# Patient Record
Sex: Male | Born: 1990 | Race: Black or African American | Hispanic: No | Marital: Single | Smoking: Never smoker
Health system: Southern US, Community
[De-identification: ages and names within clinical notes are randomized; demographics above are authoritative.]

## PROBLEM LIST (undated history)

## (undated) HISTORY — PX: NO PAST SURGERIES: SHX2092

---

## 2012-04-15 ENCOUNTER — Other Ambulatory Visit: Payer: Self-pay | Admitting: *Deleted

## 2012-04-15 ENCOUNTER — Other Ambulatory Visit: Payer: Self-pay | Admitting: Family Medicine

## 2012-04-15 DIAGNOSIS — M25562 Pain in left knee: Secondary | ICD-10-CM

## 2012-04-17 ENCOUNTER — Other Ambulatory Visit: Payer: Self-pay

## 2012-04-19 ENCOUNTER — Ambulatory Visit
Admission: RE | Admit: 2012-04-19 | Discharge: 2012-04-19 | Disposition: A | Discharge status: Home / Self Care | Payer: Managed Care, Other (non HMO) | Source: Ambulatory Visit | Attending: *Deleted | Admitting: *Deleted

## 2012-04-19 ENCOUNTER — Other Ambulatory Visit: Payer: Self-pay

## 2012-04-19 DIAGNOSIS — M25562 Pain in left knee: Secondary | ICD-10-CM

## 2013-07-12 ENCOUNTER — Emergency Department (HOSPITAL_COMMUNITY): Payer: Managed Care, Other (non HMO)

## 2013-07-12 ENCOUNTER — Encounter (HOSPITAL_COMMUNITY): Payer: Self-pay | Admitting: Emergency Medicine

## 2013-07-12 ENCOUNTER — Emergency Department (INDEPENDENT_AMBULATORY_CARE_PROVIDER_SITE_OTHER)
Admission: EM | Admit: 2013-07-12 | Discharge: 2013-07-12 | Disposition: A | Discharge status: Home / Self Care | Payer: Managed Care, Other (non HMO) | Source: Home / Self Care | Attending: Emergency Medicine | Admitting: Emergency Medicine

## 2013-07-12 DIAGNOSIS — K2970 Gastritis, unspecified, without bleeding: Secondary | ICD-10-CM

## 2013-07-12 DIAGNOSIS — E860 Dehydration: Secondary | ICD-10-CM

## 2013-07-12 DIAGNOSIS — R112 Nausea with vomiting, unspecified: Secondary | ICD-10-CM

## 2013-07-12 LAB — COMPREHENSIVE METABOLIC PANEL
ALT: 16 U/L (ref 0–53)
AST: 25 U/L (ref 0–37)
Albumin: 4.2 g/dL (ref 3.5–5.2)
CO2: 29 mEq/L (ref 19–32)
Calcium: 9.3 mg/dL (ref 8.4–10.5)
Creatinine, Ser: 0.95 mg/dL (ref 0.50–1.35)
GFR calc Af Amer: >90 mL/min (ref >90)
GFR calc non Af Amer: >90 mL/min (ref >90)
Sodium: 140 mEq/L (ref 135–145)
Total Protein: 8 g/dL (ref 6.0–8.3)

## 2013-07-12 LAB — CBC WITH DIFFERENTIAL/PLATELET
Basophils Absolute: 0 10*3/uL (ref 0.0–0.1)
Basophils Relative: 0 % (ref 0–1)
Eosinophils Absolute: 0 10*3/uL (ref 0.0–0.7)
Eosinophils Relative: 0 % (ref 0–5)
Lymphs Abs: 1.1 10*3/uL (ref 0.7–4.0)
MCH: 30.5 pg (ref 26.0–34.0)
MCV: 86.3 fL (ref 78.0–100.0)
Monocytes Absolute: 0.9 10*3/uL (ref 0.1–1.0)
Neutrophils Relative %: 63 % (ref 43–77)
Platelets: 231 10*3/uL (ref 150–400)
RDW: 12.8 % (ref 11.5–15.5)

## 2013-07-12 LAB — LIPASE, BLOOD: Lipase: 25 U/L (ref 11–59)

## 2013-07-12 LAB — POCT URINALYSIS DIP (DEVICE)
Leukocytes, UA: NEGATIVE
Nitrite: NEGATIVE
Protein, ur: NEGATIVE mg/dL
Urobilinogen, UA: 1 mg/dL (ref 0.0–1.0)

## 2013-07-12 MED ORDER — SUCRALFATE 1 GM/10ML PO SUSP: 1.0000 g | Freq: Three times a day (TID) | ORAL | Status: DC

## 2013-07-12 MED ORDER — GI COCKTAIL ~~LOC~~
30.0000 mL | Freq: Once | ORAL | Status: AC
  Administered 2013-07-12: 30 mL via ORAL

## 2013-07-12 MED ORDER — GI COCKTAIL ~~LOC~~
ORAL | Status: AC
  Filled 2013-07-12: qty 30

## 2013-07-12 MED ORDER — OMEPRAZOLE 40 MG PO CPDR: 40.0000 mg | DELAYED_RELEASE_CAPSULE | Freq: Every day | ORAL | Status: DC

## 2013-07-12 MED ORDER — ONDANSETRON 8 MG PO TBDP: 8.0000 mg | ORAL_TABLET | Freq: Three times a day (TID) | ORAL | Status: AC | PRN

## 2013-07-12 NOTE — ED Provider Notes (Signed)
CSN: [id number]     Arrival date & time 07/12/13  1343 History   First MD Initiated Contact with Patient 07/12/13 1506     Chief Complaint  Patient presents with  . Abdominal Pain   (Consider location/radiation/quality/duration/timing/severity/associated sxs/prior Treatment) HPI Comments: 22 year old male presents complaining of 5 days of nausea and vomiting, mild constipation. He started to have nausea with some vomiting. After multiple episodes of vomiting, he started to have some blood in the vomitus. He started to have abdominal pain after having the symptoms for 2 days. He is not currently feeling nauseous, he last vomited this morning. He has not had any blood in the vomitus since yesterday. He feels the abdominal pain in his upper abdomen, constant, aching, nonradiating. He notes that he does drink alcohol does not know if this could contribute. No history of any stomach surgeries. Does not take NSAIDs on a regular basis. He describes the constipation as hard balls of stool, he has had daily bowel movements though  Patient is a 22 y.o. male presenting with abdominal pain.  Abdominal Pain Associated symptoms: constipation, nausea and vomiting   Associated symptoms: no chest pain, no chills, no cough, no diarrhea, no dysuria, no fatigue, no fever, no hematuria, no shortness of breath and no sore throat     History reviewed. No pertinent past medical history. History reviewed. No pertinent past surgical history. History reviewed. No pertinent family history. History  Substance Use Topics  . Smoking status: Never Smoker   . Smokeless tobacco: Not on file  . Alcohol Use: Yes    Review of Systems  Constitutional: Negative for fever, chills and fatigue.  HENT: Negative for sore throat.   Eyes: Negative for visual disturbance.  Respiratory: Negative for cough and shortness of breath.   Cardiovascular: Negative for chest pain, palpitations and leg swelling.  Gastrointestinal: Positive  for nausea, vomiting, abdominal pain and constipation. Negative for diarrhea.  Genitourinary: Negative for dysuria, urgency, frequency and hematuria.  Musculoskeletal: Negative for arthralgias, myalgias, neck pain and neck stiffness.  Skin: Negative for rash.  Neurological: Negative for dizziness, weakness and light-headedness.    Allergies  Review of patient's allergies indicates no known allergies.  Home Medications   Current Outpatient Rx  Name  Route  Sig  Dispense  Refill  . omeprazole (PRILOSEC) 40 MG capsule   Oral   Take 1 capsule (40 mg total) by mouth daily.   30 capsule   0   . ondansetron (ZOFRAN ODT) 8 MG disintegrating tablet   Oral   Take 1 tablet (8 mg total) by mouth every 8 (eight) hours as needed for nausea or vomiting.   12 tablet   0   . sucralfate (CARAFATE) 1 GM/10ML suspension   Oral   Take 10 mLs (1 g total) by mouth 4 (four) times daily -  with meals and at bedtime.   420 mL   0    BP 120/81  Pulse 95  Temp(Src) 99.8 F (37.7 C) (Oral)  Resp 20  SpO2 98% Physical Exam  Nursing note and vitals reviewed. Constitutional: He is oriented to person, place, and time. He appears well-developed and well-nourished. No distress.  HENT:  Head: Normocephalic.  Cardiovascular: Regular rhythm and normal heart sounds.  Tachycardia present.   Pulmonary/Chest: Effort normal. No respiratory distress.  Abdominal: Normal appearance and bowel sounds are normal. There is no hepatosplenomegaly. There is tenderness in the epigastric area and left upper quadrant. There is no rigidity, no rebound,  no guarding, no CVA tenderness, no tenderness at McBurney's point and negative Murphy's sign. No hernia.  Neurological: He is alert and oriented to person, place, and time. Coordination normal.  Skin: Skin is warm and dry. No rash noted. He is not diaphoretic.  Psychiatric: He has a normal mood and affect. Judgment normal.    ED Course  Procedures (including critical care  time) Labs Review Labs Reviewed  CBC WITH DIFFERENTIAL - Abnormal; Notable for the following:    Monocytes Relative 17 (*)    All other components within normal limits  COMPREHENSIVE METABOLIC PANEL  LIPASE, BLOOD  POCT URINALYSIS DIP (DEVICE)   Imaging Review No results found.    MDM   1. Gastritis   2. Nausea & vomiting   3. Dehydration    Modest improvement after GI cocktail. This patient is most likely experiencing gastritis and has had a Mallory-Weiss tear. He is still had some vomiting without blood so this has most likely healed. His tachycardia is explained by dehydration, evidence by the orthostatic vital signs. He declines any IV fluids and prefers oral rehydration. We will treat for gastritis, also sending some labs to check for any other gross abnormalities. He will return to the emergency department if his symptoms worsen.   All labs came back completely normal.  Meds ordered this encounter  Medications  . gi cocktail (Maalox,Lidocaine,Donnatal)    Sig:   . omeprazole (PRILOSEC) 40 MG capsule    Sig: Take 1 capsule (40 mg total) by mouth daily.    Dispense:  30 capsule    Refill:  0    Order Specific Question:  Supervising Provider    Answer:  Lorenz Coaster, DAVID C [id number]  . sucralfate (CARAFATE) 1 GM/10ML suspension    Sig: Take 10 mLs (1 g total) by mouth 4 (four) times daily -  with meals and at bedtime.    Dispense:  420 mL    Refill:  0    Order Specific Question:  Supervising Provider    Answer:  Lorenz Coaster, DAVID C [id number]  . ondansetron (ZOFRAN ODT) 8 MG disintegrating tablet    Sig: Take 1 tablet (8 mg total) by mouth every 8 (eight) hours as needed for nausea or vomiting.    Dispense:  12 tablet    Refill:  0    Order Specific Question:  Supervising Provider    Answer:  Lorenz Coaster, DAVID C [[id number]]     Graylon Good, PA-C 07/13/13 2014

## 2013-07-12 NOTE — ED Notes (Signed)
C/o upper abdominal pain since Tuesday; w n/v, ?constipation?; drinks 2-3 days a week , consuming 5-6 drinks. Last consumption Saturday (reportedly okay Sunday and Monday )

## 2013-07-14 NOTE — ED Provider Notes (Signed)
Medical screening examination/treatment/procedure(s) were performed by non-physician practitioner and as supervising physician I was immediately available for consultation/collaboration.  Leslee Home, M.D.  Reuben Likes, MD 07/14/13 [phone]

## 2014-08-10 ENCOUNTER — Ambulatory Visit (INDEPENDENT_AMBULATORY_CARE_PROVIDER_SITE_OTHER): Payer: Self-pay | Admitting: Gastroenterology

## 2014-08-10 ENCOUNTER — Encounter: Payer: Self-pay | Admitting: Gastroenterology

## 2014-08-10 VITALS — BP 132/80 | HR 92 | Ht 67.75 in | Wt 159.5 lb

## 2014-08-10 DIAGNOSIS — R1013 Epigastric pain: Secondary | ICD-10-CM | POA: Insufficient documentation

## 2014-08-10 MED ORDER — HYOSCYAMINE SULFATE ER 0.375 MG PO TBCR: EXTENDED_RELEASE_TABLET | ORAL | Status: AC

## 2014-08-10 NOTE — Assessment & Plan Note (Signed)
One year history of progressive mid abdominal pain that is exacerbated by eating, often spontaneous and occasionally, and by nausea and vomiting.  Symptoms not responsive to Carafate and omeprazole.  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  Recommendations #1 upper endoscopy #2 trial of hyomax #3 discontinued Carafate

## 2014-08-10 NOTE — Patient Instructions (Signed)
You have been scheduled for an endoscopy. Please follow written instructions given to you at your visit today. If you use inhalers (even only as needed), please bring them with you on the day of your procedure. Your physician has requested that you go to www.startemmi.com and enter the access code given to you at your visit today. This web site gives a general overview about your procedure. However, you should still follow specific instructions given to you by our office regarding your preparation for the procedure.  Your prescriptions have been sent to your pharmacy

## 2014-08-10 NOTE — Progress Notes (Signed)
    _                                                                                                                History of Present Illness:  [name] is a 24 year old male for evaluation of abdominal pain.  Over the past year he's been complaining of periumbilical pain that radiates to his midepigastrium.  Pain is spontaneous but clearly worsens with eating.  It is occasionally accompanied by nausea and vomiting.  He will also awaken with discomfort.  He is on no gastric irritants including nonsteroidals.  He drinks only rarely.  He takes both Carafate and Prilosec without relief.   History reviewed. No pertinent past medical history. Past Surgical History  Procedure Laterality Date  . No past surgeries     family history includes Liver cancer (age of onset: 7654) in his father; Thyroid cancer in his mother. There is no history of Colon cancer, Colon polyps, Diabetes, Esophageal cancer, Gallbladder disease, or Heart disease. Current Outpatient Prescriptions  Medication Sig Dispense Refill  . omeprazole (PRILOSEC) 40 MG capsule Take 1 capsule (40 mg total) by mouth daily. 30 capsule 0  . ondansetron (ZOFRAN ODT) 8 MG disintegrating tablet Take 1 tablet (8 mg total) by mouth every 8 (eight) hours as needed for nausea or vomiting. 12 tablet 0  . sucralfate (CARAFATE) 1 GM/10ML suspension Take 10 mLs (1 g total) by mouth 4 (four) times daily -  with meals and at bedtime. 420 mL 0   No current facility-administered medications for this visit.   Allergies as of 08/10/2014  . (No Known Allergies)    reports that he has never smoked. He has never used smokeless tobacco. He reports that he drinks alcohol. He reports that he does not use illicit drugs.   Review of Systems: Pertinent positive and negative review of systems were noted in the above HPI section. All other review of systems were otherwise negative.  Vital signs were reviewed in today's medical record Physical  Exam: General: Well developed , well nourished, no acute distress Skin: anicteric Head: Normocephalic and atraumatic Eyes:  sclerae anicteric, EOMI Ears: Normal auditory acuity Mouth: No deformity or lesions Neck: Supple, no masses or thyromegaly Lungs: Clear throughout to auscultation Heart: Regular rate and rhythm; no murmurs, rubs or bruits Abdomen: Soft,and non distended. No masses, hepatosplenomegaly or hernias noted. Normal Bowel sounds.  There is mild tenderness in the midepigastrium Rectal:deferred Musculoskeletal: Symmetrical with no gross deformities  Skin: No lesions on visible extremities Pulses:  Normal pulses noted Extremities: No clubbing, cyanosis, edema or deformities noted Neurological: Alert oriented x 4, grossly nonfocal Cervical Nodes:  No significant cervical adenopathy Inguinal Nodes: No significant inguinal adenopathy Psychological:  Alert and cooperative. Normal mood and affect  See Assessment and Plan under Problem List

## 2014-08-19 ENCOUNTER — Ambulatory Visit (AMBULATORY_SURGERY_CENTER): Payer: Managed Care, Other (non HMO) | Admitting: Gastroenterology

## 2014-08-19 ENCOUNTER — Encounter: Payer: Self-pay | Admitting: Gastroenterology

## 2014-08-19 VITALS — BP 116/67 | HR 84 | Temp 98.3°F | Resp 17 | Ht 67.0 in | Wt 159.0 lb

## 2014-08-19 DIAGNOSIS — R1013 Epigastric pain: Secondary | ICD-10-CM

## 2014-08-19 DIAGNOSIS — R112 Nausea with vomiting, unspecified: Secondary | ICD-10-CM

## 2014-08-19 MED ORDER — SODIUM CHLORIDE 0.9 % IV SOLN: 500.0000 mL | INTRAVENOUS | Status: DC

## 2014-08-19 NOTE — Op Note (Signed)
[facility] [address] [place], [id number]   ENDOSCOPY PROCEDURE REPORT  PATIENT: Glenn Wells, [name]  MR#: [id number] BIRTHDATE: 02/14/1991 , 23  yrs. old GENDER: male ENDOSCOPIST: Louis Meckelobert D Leonora Gores, MD REFERRED BY:  Windle GuardWilson Elkins, M.D. PROCEDURE DATE:  08/19/2014 PROCEDURE:  EGD, diagnostic ASA CLASS:     Class I INDICATIONS:  nausea and vomiting. MEDICATIONS: Monitored anesthesia care and Propofol 400 mg IV TOPICAL ANESTHETIC:  DESCRIPTION OF PROCEDURE: After the risks benefits and alternatives of the procedure were thoroughly explained, informed consent was obtained.  The LB [id number] endoscope was introduced through the mouth and advanced to the third portion of the duodenum , Without limitations.  The instrument was slowly withdrawn as the mucosa was fully examined.      EXAM: The esophagus and gastroesophageal junction were completely normal in appearance.  The stomach was entered and closely examined.The antrum, angularis, and lesser curvature were well visualized, including a retroflexed view of the cardia and fundus. The stomach wall was normally distensable.  The scope passed easily through the pylorus into the duodenum.  Retroflexed views revealed no abnormalities.     The scope was then withdrawn from the patient and the procedure completed.  COMPLICATIONS: There were no immediate complications.  ENDOSCOPIC IMPRESSION: Normal appearing esophagus and GE junction, the stomach was well visualized and normal in appearance, normal appearing duodenum  RECOMMENDATIONS: Hold omeprazole Hyomax as needed abdominal pain  REPEAT EXAM:  eSigned:  Louis Meckelobert D Shernell Saldierna, MD 08/19/2014 9:48 AM    CC:

## 2014-08-19 NOTE — Progress Notes (Signed)
A/ox3, pleased with MAC, report to RN 

## 2014-08-19 NOTE — Patient Instructions (Signed)

## 2014-08-19 NOTE — Progress Notes (Addendum)
Pt was hiccupping when coming into recovery, pt was still asleep, pt then started coughing and foaming at the mouth, called for suction and Joe Rybicki CRNA, laid pt flat, rolled pt on left side and suctioned, as pt started awaking up pt continued to cough up fluid, once pt was awake and could respond to us pt was set up right, gave pt an emesis basin and left suction with pt with instruction on how to use it if needed. Pt had hippa sign hanging but under circumstance asked pt who was with him and if they could come back, pt stated "that is fine" Pt did not use suction but spit into emesis basin, pt did spit up some pink tinged fluid, advised pt of complications from EGD and what to watch for at home, pt verbalized understanding, also reaffirmed with pt that it was ok for care partner (after care partner had gone to get the car) to be back in recovery before pt left and pt stated "yes"-adm  O2 sats did not drop while in recovery.

## 2014-08-20 ENCOUNTER — Telehealth: Payer: Self-pay | Admitting: *Deleted

## 2014-08-20 NOTE — Telephone Encounter (Signed)
Number identifier, left message, follow-up  

## 2014-08-28 ENCOUNTER — Telehealth: Payer: Self-pay | Admitting: Gastroenterology

## 2014-08-28 ENCOUNTER — Encounter: Payer: Self-pay | Admitting: Gastroenterology

## 2014-08-28 NOTE — Telephone Encounter (Signed)
Patient had not stopped the Omeprazole. He has been taking the Hyomax. He continues to have stomach pain. He will stop the Omeprazole. Continue the Hyomax as directed. He is supposed to begin a new job. The start date has been postponed pending resolution of his "health issues." He wants to be able to tell them when he thinks he can start. He will call back 08/31/14 with an update.

## 2014-08-31 ENCOUNTER — Ambulatory Visit: Payer: Managed Care, Other (non HMO) | Admitting: Nurse Practitioner

## 2014-08-31 NOTE — Telephone Encounter (Signed)
Spoke with patient and he states the Hyomax is not helping his abdominal pain. He states he is moving this week and would like an OV prior to this. Scheduled with Willette ClusterPaula Guenther, NP today at 3:00 PM.

## 2014-09-01 ENCOUNTER — Encounter: Payer: Self-pay | Admitting: Nurse Practitioner

## 2014-09-01 ENCOUNTER — Ambulatory Visit (INDEPENDENT_AMBULATORY_CARE_PROVIDER_SITE_OTHER): Payer: Managed Care, Other (non HMO) | Admitting: Nurse Practitioner

## 2014-09-01 ENCOUNTER — Other Ambulatory Visit (INDEPENDENT_AMBULATORY_CARE_PROVIDER_SITE_OTHER): Payer: Managed Care, Other (non HMO)

## 2014-09-01 VITALS — BP 102/72 | HR 60 | Ht 69.0 in | Wt 162.0 lb

## 2014-09-01 DIAGNOSIS — N508 Other specified disorders of male genital organs: Secondary | ICD-10-CM

## 2014-09-01 DIAGNOSIS — N50819 Testicular pain, unspecified: Secondary | ICD-10-CM

## 2014-09-01 DIAGNOSIS — R1084 Generalized abdominal pain: Secondary | ICD-10-CM

## 2014-09-01 DIAGNOSIS — R109 Unspecified abdominal pain: Secondary | ICD-10-CM

## 2014-09-01 LAB — URINALYSIS, ROUTINE W REFLEX MICROSCOPIC
Bilirubin Urine: NEGATIVE
Hgb urine dipstick: NEGATIVE
KETONES UR: NEGATIVE
Leukocytes, UA: NEGATIVE
NITRITE: NEGATIVE
Total Protein, Urine: NEGATIVE
Urine Glucose: NEGATIVE
Urobilinogen, UA: 0.2 (ref 0.0–1.0)
WBC, UA: NONE SEEN — AB (ref 0–?)
pH: 6 (ref 5.0–8.0)

## 2014-09-01 NOTE — Patient Instructions (Signed)
You have been scheduled for a CT scan of the abdomen and pelvis at [facility] ([address]---this is in the same building as Press [facility]).   You are scheduled on 09-07-14 at 3:00pm. You should arrive 15 minutes prior to your appointment time for registration. Please follow the written instructions below on the day of your exam:  WARNING: IF YOU ARE ALLERGIC TO IODINE/X-RAY DYE, PLEASE NOTIFY RADIOLOGY IMMEDIATELY AT [phone]! YOU WILL BE GIVEN A 13 HOUR PREMEDICATION PREP.  1) Do not eat or drink anything after 11:00pm (4 hours prior to your test) 2) You have been given 2 bottles of oral contrast to drink. The solution may taste  better if refrigerated, but do NOT add ice or any other liquid to this solution. Shake well before drinking.    Drink 1 bottle of contrast @ 1:00pm (2 hours prior to your exam)  Drink 1 bottle of contrast @ 2:00pm (1 hour prior to your exam)  You may take any medications as prescribed with a small amount of water except for the following: Metformin, Glucophage, Glucovance, Avandamet, Riomet, Fortamet, Actoplus Met, Janumet, Glumetza or Metaglip. The above medications must be held the day of the exam AND 48 hours after the exam.  The purpose of you drinking the oral contrast is to aid in the visualization of your intestinal tract. The contrast solution may cause some diarrhea. Before your exam is started, you will be given a small amount of fluid to drink. Depending on your individual set of symptoms, you may also receive an intravenous injection of x-ray contrast/dye. Plan on being at [facility] for 30 minutes or long, depending on the type of exam you are having performed.  This test typically takes 30-45 minutes to complete.  If you have any questions regarding your exam or if you need to reschedule, you may call the CT department at [phone] between the hours of 8:00 am and 5:00 pm,  Monday-Friday.  ________________________________________________________________________   Your physician has requested that you go to the basement for the following lab work before leaving today: UA  PJ:[id number]

## 2014-09-01 NOTE — Progress Notes (Signed)
     History of Present Illness:   Patient is a 24 year old male recently evaluated by us for abdominal pain, nausea and vomiting. Pain was described as periumbilical/mid epigastrium, worse with eating.  Symptoms were refractory to Prilosec and Carafate. For evaluation patient underwent upper endoscopy 08/19/14 which was normal. Following EGD Prilosec was stopped and trial of  Hyomax given.   Patient called the office a few days ago with persistent abdominal pain. The nausea and vomiting have resolved. Hyomax caused increased heartburn. The periumbilical / mid upper abdominal pain has been present for about 1 year but does seem to be getting worse. At his first visit here patient reported exacerbation of pain with meals, today he says there are no known triggers as pain as just random. Pain does not wake up him up at night. Laying down and stretching out seems to help the discomfort.  No urinary symptoms. No fevers.  Bowel movements normal. Patient apparently saw his PCP and Claris GowerCharlotte a few weeks ago, told labs were all normal. Since then patient has developed some left-sided testicular discomfort  Current Medications, Allergies, Past Medical History, Past Surgical History, Family History and Social History were reviewed in [facility] electronic medical record.  Physical Exam: General: Pleasant, well developed , black male in no acute distress Head: Normocephalic and atraumatic Eyes:  sclerae anicteric, conjunctiva pink  Ears: Normal auditory acuity Lungs: Clear throughout to auscultation Heart: Regular rate and rhythm Abdomen: Soft, non distended, non-tender. No masses, no hepatomegaly. Normal bowel sounds Musculoskeletal: Symmetrical with no gross deformities  Extremities: No edema  Neurological: Alert oriented x 4, grossly nonfocal Psychological:  Alert and cooperative. Normal mood and affect  Assessment and Recommendations:    24 year old male with a one-year history of  periumbilical / mid upper abdominal pain / nausea and vomiting. Negative endoscopy. Nausea and vomiting have resolved but pain is persistent without any aggravating factors. PPI, Carafate nor bowel anti-spasmodics have been helpful. Patient tells me labs by PCP and Claris Gowerharlotte have all been normal . Patient is now endorsing some left testicular discomfort. Will check a urinalysis. Given the persistent pain will proceed with a CT scan of the abdomen and pelvis. We will call him with results. Advised to see PCP about testicular discomfort

## 2014-09-02 ENCOUNTER — Ambulatory Visit: Payer: Managed Care, Other (non HMO) | Admitting: Nurse Practitioner

## 2014-09-02 ENCOUNTER — Ambulatory Visit (INDEPENDENT_AMBULATORY_CARE_PROVIDER_SITE_OTHER)
Admission: RE | Admit: 2014-09-02 | Discharge: 2014-09-02 | Disposition: A | Discharge status: Home / Self Care | Payer: Managed Care, Other (non HMO) | Source: Ambulatory Visit | Attending: Nurse Practitioner | Admitting: Nurse Practitioner

## 2014-09-02 DIAGNOSIS — N50819 Testicular pain, unspecified: Secondary | ICD-10-CM | POA: Insufficient documentation

## 2014-09-02 DIAGNOSIS — R109 Unspecified abdominal pain: Secondary | ICD-10-CM

## 2014-09-02 MED ORDER — IOHEXOL 300 MG/ML  SOLN
100.0000 mL | Freq: Once | INTRAMUSCULAR | Status: AC | PRN
  Administered 2014-09-02: 100 mL via INTRAVENOUS

## 2014-09-02 NOTE — Progress Notes (Signed)
Reviewed and agree with management. Robert D. Kaplan, M.D., FACG  

## 2014-09-04 ENCOUNTER — Telehealth: Payer: Self-pay | Admitting: Nurse Practitioner

## 2014-09-07 ENCOUNTER — Telehealth: Payer: Self-pay | Admitting: Nurse Practitioner

## 2014-09-07 ENCOUNTER — Inpatient Hospital Stay: Admission: RE | Admit: 2014-09-07 | Payer: Managed Care, Other (non HMO) | Source: Ambulatory Visit

## 2014-09-07 NOTE — Telephone Encounter (Signed)
Left message for patient to call back  

## 2014-09-07 NOTE — Telephone Encounter (Signed)
See additional phone note for details from 2/15

## 2014-09-07 NOTE — Telephone Encounter (Signed)
Patient notified [place] moved [place]

## 2015-05-19 IMAGING — CT CT ABD-PELV W/ CM
2 of 4 series · 17 of 46 positions shown, 19 images · IV contrast (omnipaque)
Comparison: None.

CLINICAL DATA: Under show evaluation for generalized intermittent
periumbilical abdominal pain which is worse over the past year with
nausea and vomiting and now with fatigue and heartburn

EXAM:
CT ABDOMEN AND PELVIS WITH CONTRAST
TECHNIQUE: Multidetector CT imaging of the abdomen and pelvis was performed
using the standard protocol following bolus administration of
intravenous contrast.
CONTRAST:  100mL OMNIPAQUE IOHEXOL 300 MG/ML  SOLN

[Series 2: abd/ pel 5mm · axial · 0.64mm/px · z∈[+807,+1222]mm · 14 of 91 slices shown, 16 images]
[im 4/91  soft-tissue]
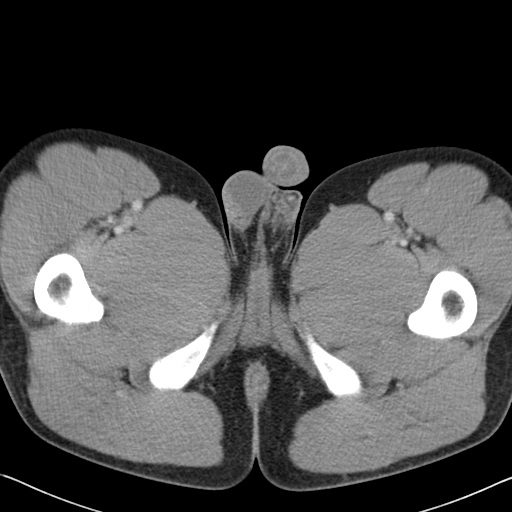
[im 4/91  bone]
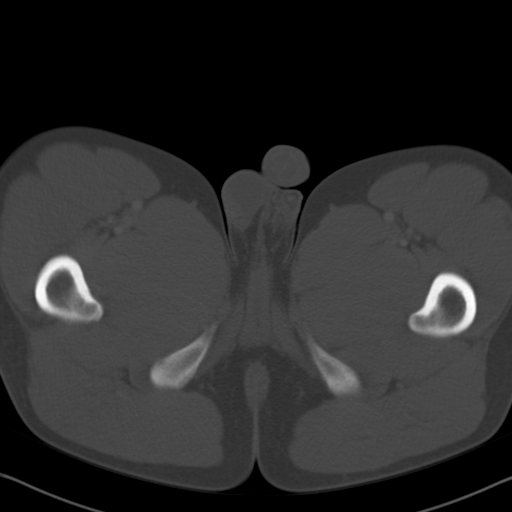
[im 11/91  soft-tissue]
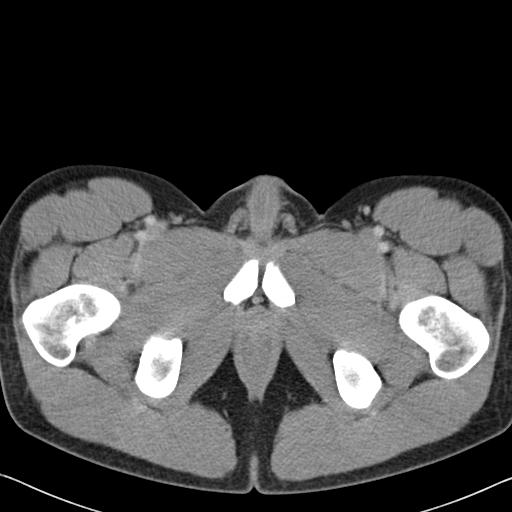
[im 19/91  soft-tissue]
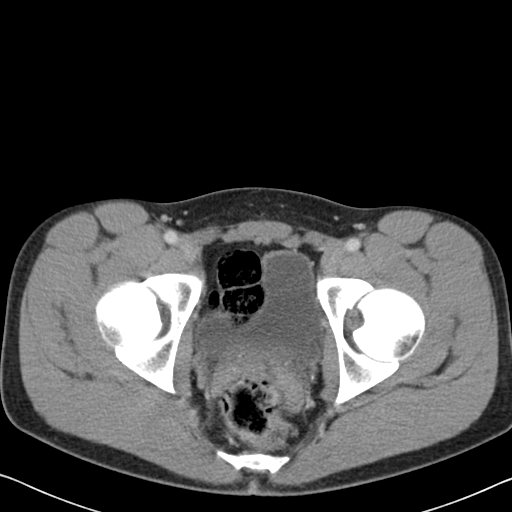
[im 26/91  soft-tissue]
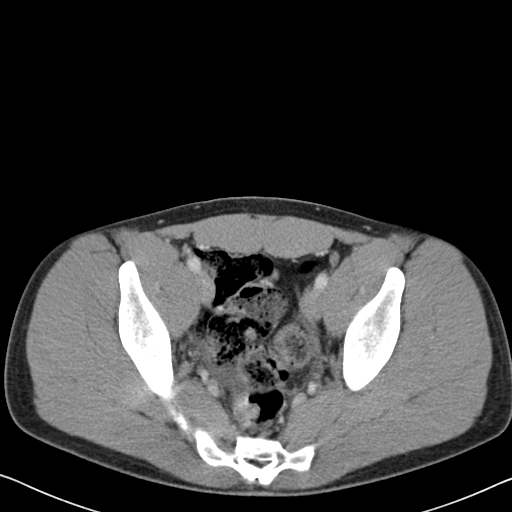
[im 29/91  soft-tissue]
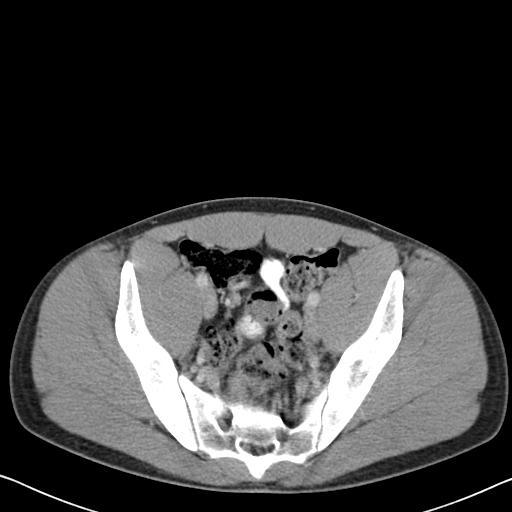
[im 37/91  soft-tissue]
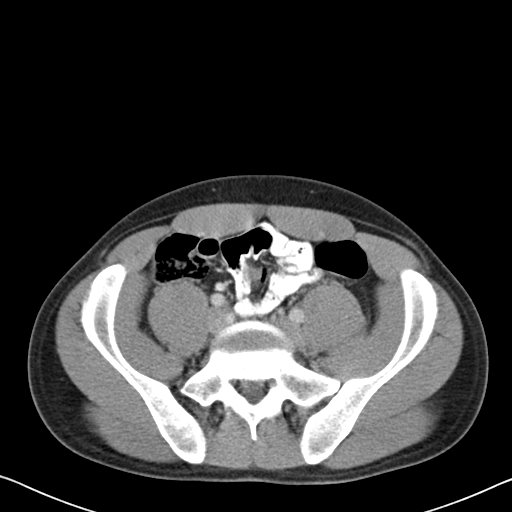
[im 44/91  soft-tissue]
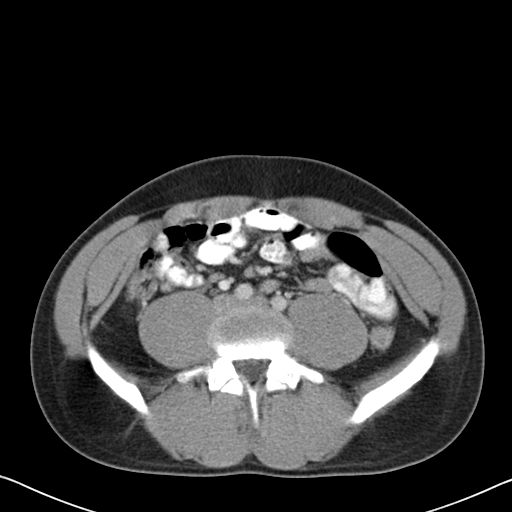
[im 47/91  soft-tissue]
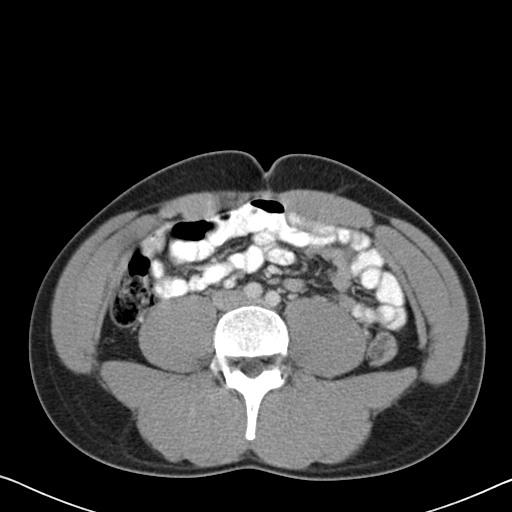
[im 55/91  soft-tissue]
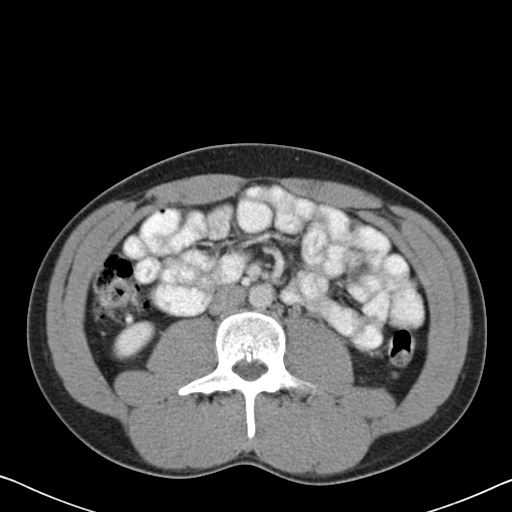
[im 55/91  bone]
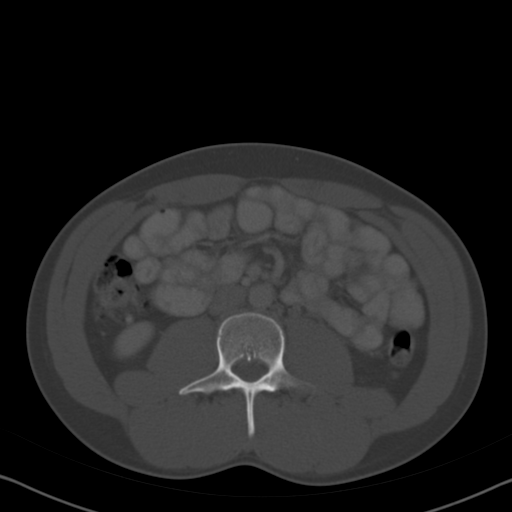
[im 62/91  soft-tissue]
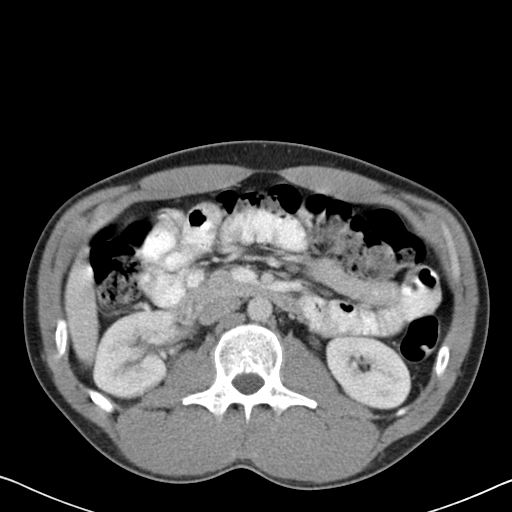
[im 69/91  soft-tissue]
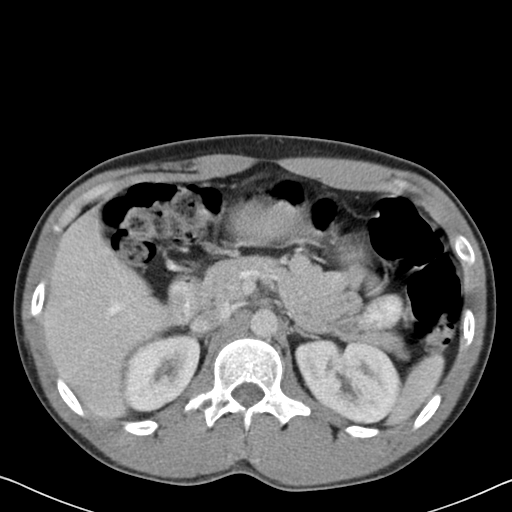
[im 73/91  soft-tissue]
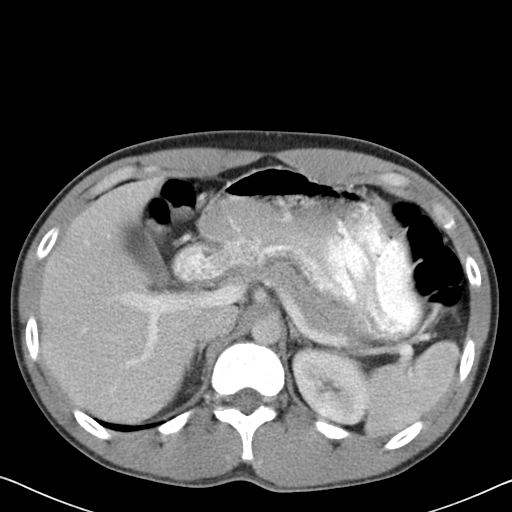
[im 80/91  soft-tissue]
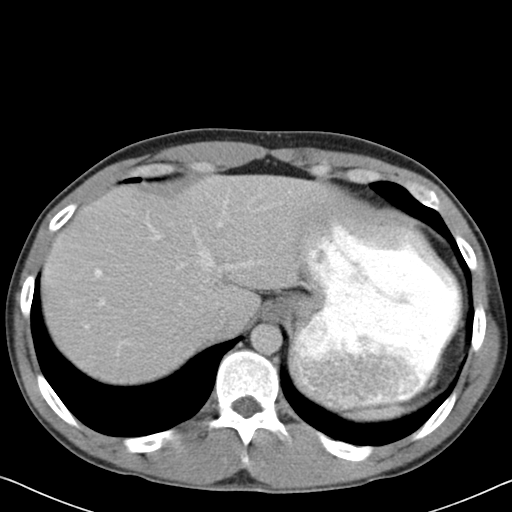
[im 87/91  soft-tissue]
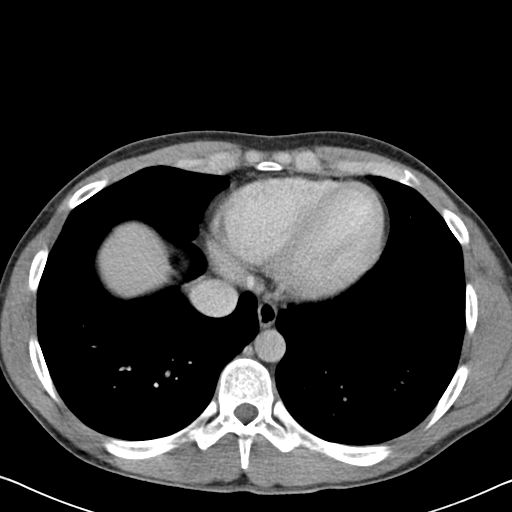

[Series 602: cor · coronal · 0.91mm/px · 3 of 110 slices shown]
[im 37/110  soft-tissue]
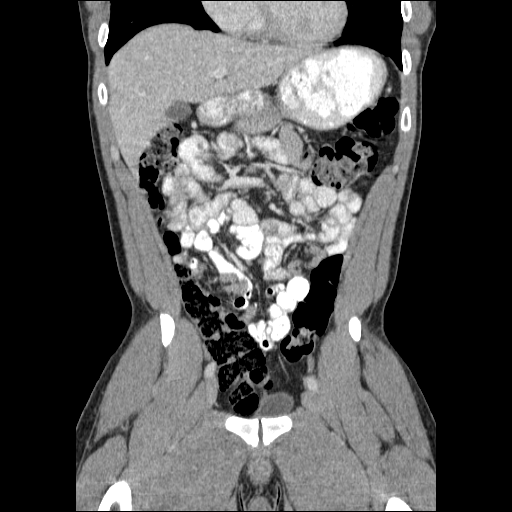
[im 49/110  soft-tissue]
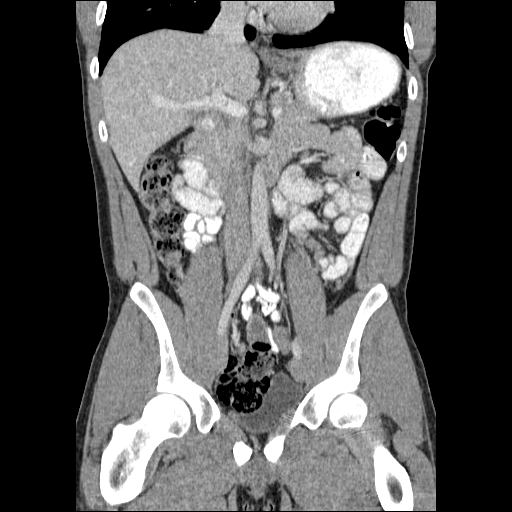
[im 61/110  soft-tissue]
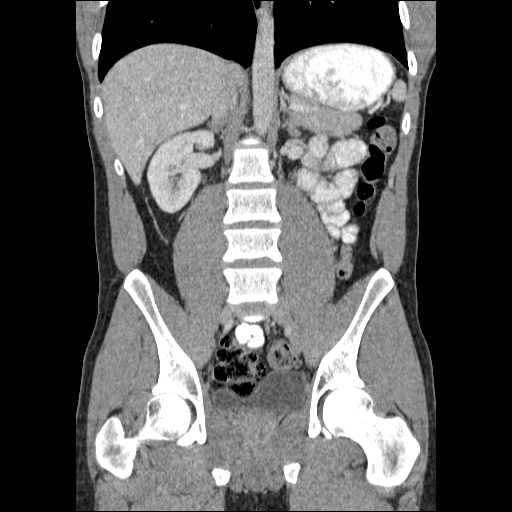

[17 of 46 positions shown; findings below may reference images not displayed]

FINDINGS: The visualized portions of the lung bases are clear. Heart size
appears normal.

3 mm low-attenuation lesion in the dome of the liver too small to
characterize possibly a cyst. Liver is otherwise normal. Gallbladder
is normal. Spleen is normal. Pancreas is normal.

Adrenal glands are normal. Kidneys are normal. Abdominal aorta is
normal.

Bladder is normal.  Reproductive organs are normal.

Stomach is normal. Small bowel is normal. Appendix is normal. Large
bowel is normal.

No significant adenopathy.  No ascites.

No acute musculoskeletal findings.
IMPRESSION: No significant abnormalities. 3 mm nonspecific likely
inconsequential round low-attenuation lesion in the dome of the
liver statistically likely a cyst. No findings to explain patient's
symptoms.
